# Patient Record
Sex: Male | Born: 1950 | Race: Black or African American | Hispanic: No | Marital: Married | Smoking: Former smoker
Health system: Southern US, Community
[De-identification: ages and names within clinical notes are randomized; demographics above are authoritative.]

## PROBLEM LIST (undated history)

## (undated) DIAGNOSIS — E119 Type 2 diabetes mellitus without complications: Secondary | ICD-10-CM

## (undated) DIAGNOSIS — I10 Essential (primary) hypertension: Secondary | ICD-10-CM

## (undated) DIAGNOSIS — E7800 Pure hypercholesterolemia, unspecified: Secondary | ICD-10-CM

## (undated) HISTORY — PX: OTHER SURGICAL HISTORY: SHX169

---

## 2000-02-09 ENCOUNTER — Emergency Department (HOSPITAL_COMMUNITY): Admission: EM | Admit: 2000-02-09 | Discharge: 2000-02-09 | Payer: Self-pay | Admitting: Emergency Medicine

## 2003-12-29 ENCOUNTER — Inpatient Hospital Stay (HOSPITAL_COMMUNITY): Admission: EM | Admit: 2003-12-29 | Discharge: 2004-01-01 | Payer: Self-pay | Admitting: Emergency Medicine

## 2004-01-22 ENCOUNTER — Encounter (HOSPITAL_COMMUNITY): Admission: RE | Admit: 2004-01-22 | Discharge: 2004-04-21 | Payer: Self-pay | Admitting: Cardiology

## 2004-06-05 ENCOUNTER — Emergency Department (HOSPITAL_COMMUNITY): Admission: EM | Admit: 2004-06-05 | Discharge: 2004-06-05 | Payer: Self-pay | Admitting: Emergency Medicine

## 2004-07-12 ENCOUNTER — Ambulatory Visit: Payer: Self-pay

## 2004-07-18 ENCOUNTER — Ambulatory Visit: Payer: Self-pay | Admitting: Cardiology

## 2004-09-30 ENCOUNTER — Ambulatory Visit: Payer: Self-pay | Admitting: *Deleted

## 2005-04-30 ENCOUNTER — Ambulatory Visit: Payer: Self-pay | Admitting: Cardiology

## 2006-12-03 ENCOUNTER — Ambulatory Visit: Payer: Self-pay | Admitting: Cardiology

## 2007-12-08 ENCOUNTER — Ambulatory Visit: Payer: Self-pay | Admitting: Cardiology

## 2007-12-08 LAB — CONVERTED CEMR LAB
Bilirubin, Direct: 0.1 mg/dL (ref 0.0–0.3)
GFR calc Af Amer: 89 mL/min
LDL Cholesterol: 82 mg/dL (ref 0–99)
Potassium: 3.9 meq/L (ref 3.5–5.1)
Sodium: 139 meq/L (ref 135–145)
Total Protein: 6.6 g/dL (ref 6.0–8.3)
Triglycerides: 78 mg/dL (ref 0–149)

## 2007-12-22 ENCOUNTER — Ambulatory Visit: Payer: Self-pay | Admitting: Cardiology

## 2009-03-10 DIAGNOSIS — E119 Type 2 diabetes mellitus without complications: Secondary | ICD-10-CM

## 2009-03-10 DIAGNOSIS — Z87891 Personal history of nicotine dependence: Secondary | ICD-10-CM

## 2009-03-10 DIAGNOSIS — I2510 Atherosclerotic heart disease of native coronary artery without angina pectoris: Secondary | ICD-10-CM | POA: Insufficient documentation

## 2009-03-10 DIAGNOSIS — E785 Hyperlipidemia, unspecified: Secondary | ICD-10-CM

## 2009-06-12 ENCOUNTER — Encounter (INDEPENDENT_AMBULATORY_CARE_PROVIDER_SITE_OTHER): Payer: Self-pay | Admitting: *Deleted

## 2009-07-25 ENCOUNTER — Ambulatory Visit: Payer: Self-pay | Admitting: Cardiology

## 2009-07-25 ENCOUNTER — Telehealth: Payer: Self-pay | Admitting: Cardiology

## 2009-07-25 DIAGNOSIS — R079 Chest pain, unspecified: Secondary | ICD-10-CM | POA: Insufficient documentation

## 2009-07-25 DIAGNOSIS — I10 Essential (primary) hypertension: Secondary | ICD-10-CM

## 2009-08-02 ENCOUNTER — Telehealth (INDEPENDENT_AMBULATORY_CARE_PROVIDER_SITE_OTHER): Payer: Self-pay | Admitting: *Deleted

## 2009-08-06 ENCOUNTER — Ambulatory Visit: Payer: Self-pay

## 2009-08-06 ENCOUNTER — Ambulatory Visit: Payer: Self-pay | Admitting: Cardiology

## 2009-08-06 ENCOUNTER — Encounter (HOSPITAL_COMMUNITY): Admission: RE | Admit: 2009-08-06 | Discharge: 2009-10-15 | Payer: Self-pay | Admitting: Cardiology

## 2009-08-06 ENCOUNTER — Encounter: Payer: Self-pay | Admitting: Cardiology

## 2010-12-24 NOTE — Assessment & Plan Note (Signed)
[facility]                            CARDIOLOGY OFFICE NOTE   ROGERIO, BOUTELLE                     MRN:          [id number]  DATE:12/22/2007                            DOB:          09-Aug-1951    PRIMARY CARE PHYSICIAN:  Dr. Dell Ponto of [facility] [place].   CLINICAL HISTORY:  Mr. Swaz[place]and is 60 years old and is an Engineering geologist  at [employer].  He returned for followup and management of his  coronary heart disease.  He had a diaphragmatic wall infarction in May  of 2005 treated with Cypher stent to the mid and distal right coronary  artery with residual 70% in the left anterior descending and 70% in the  marginal branch of the circumflex artery.  He has done quite well since  that time.  He has had no recent chest pain, shortness of breath, or  palpitations.   PAST MEDICAL HISTORY:  Significant for diabetes and hyperlipidemia.   CURRENT MEDICATIONS:  Plavix, insulin, Vytorin, enalapril, Toprol and  aspirin.   PHYSICAL EXAMINATION:  On examination, the blood pressure was 139/90 and  the pulse was 82 and regular.  There was no venous distension.  The carotid pulses were full without  bruits.  The chest was clear.  Cardiac rhythm was regular.  I could hear no murmurs or gallops.  The abdomen was soft with normal bowel sounds.  Peripheral pulses were full and there was no peripheral edema.   Recent laboratory studies showed an HDL of 29, an LDL of 82, and a total  cholesterol of 127.  His renal function and liver function tests were  normal.   IMPRESSION:  1. Coronary artery disease, status post diaphragmatic wall infarction      treated with non-overlapping Cypher stent to the mid and distal      right coronary in 2005 with residual disease as described above.  2. Good left ventricular function.  3. Insulin-dependent diabetes.  4. Hyperlipidemia.   RECOMMENDATIONS:  I talked with Mr. Swaz[place]and about  decreasing bad  carbohydrates in his diet and increasing his exercise to help with his  low HDL.  He asked about the safety of Viagra and I told him that was  safe, and I gave him a prescription for a short supply of this, and  asked  him to get a longer-term supply from his primary care physician if  needed.  Will plan to have followup in 1 year.     Bruce Elvera Lennox Juanda Chance, MD, [facility]  Electronically Signed    BRB/MedQ  DD: 12/22/2007  DT: 12/22/2007  Job #: [id number]

## 2010-12-27 NOTE — Discharge Summary (Signed)
NAME:  Jonathan Nixon, Jonathan Nixon                        ACCOUNT NO.:  [id number]   MEDICAL RECORD NO.:  [id number]                   PATIENT TYPE:  INP   LOCATION:  3734                                 FACILITY:  [facility]   PHYSICIAN:  Charlies Constable, M.D. [facility]              DATE OF BIRTH:  [birth year]   DATE OF ADMISSION:  12/29/2003  DATE OF DISCHARGE:                           DISCHARGE SUMMARY - REFERRING   DISCHARGE DIAGNOSES:  1. Acute inferior myocardial infarction, status post percutaneous coronary     intervention of the right coronary artery.  2. Ejection fraction 50%.  3. Diabetes mellitus, insulin-dependent.  4. Fever, resolved.  5. Hyperlipidemia, treated.  6. Residual coronary artery disease.  7. History of tobacco abuse.   HOSPITAL COURSE:  [name] is 60 year old male patient who developed chest  pain around 6 in the evening on Dec 29, 2003.  He thought he was  hypoglycemic at that time and treated himself for hypoglycemia.  He did feel  somewhat better, but felt that he needed to be checked and presented to the  emergency room.  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  The EKG  showed a 1- to -1.5 ST segment elevation in the inferior lateral leads.  He  was taken emergently to the cardiac catheterization lab, where he was found  to have a 95% stenosis of the right coronary artery followed by a 90%  lesion.  These were reduced to 0% lesions post procedure utilizing a CYPHER  stent.  The patient was __________  .  In addition, the patient was noted to  have a mid 70% LAD lesion as well as a 70% OM lesion and these were felt to  be needing medical therapy at this point.  __________  revealed akinesis of  the inferior wall with an EF of 50%.   The patient remained in the hospital until Jan 01, 2004.  He remained here  in stable condition, and once he was able to ambulate without difficulty, he  was  ready for discharge to home.   LABORATORY STUDIES:  During his hospital stay include blood cultures for a  fever that he ran during the hospital stay but this was felt to be secondary  to his myocardial infarction.  His blood cultures were negative.  Urinalysis  was negative.  Hemoglobin A1c was 14; however, he had just switched primary  care physicians and feels like this will be under better control, now that  his medication has been recently switched.  His maximum CK is 1226 with a MB  fraction of 77.7, maximum troponin was 8.52.   He has been on lipid-lowering agents in the past, and he will need to have a  redraw at his next appointment (or this will need to be setup).   He is discharged to home on the following  medications:  1. Lopressor 50 mg one half tablet t.i.d.  2. Aspirin 325 mg a day.  3. Zocor 40 mg q.h.s.  4. Plavix 75 mg a day.  5. He is to resume his home insulin dose.  6. Sublingual nitroglycerin p.r.n. chest pain.   No strenuous activity.  He is to progress activity as per cardiac  rehabilitation.  Remain on a low fat diet.  Clean cath site with soap and  water, no scrubbing.  Call for questions or concerns.  Followup with Dr.  Charlies Constable on January 30, 2004 at 9:30 a.m.      Guy Franco, P.A. [facility]                      Charlies Constable, M.D. [facility]    LB/MEDQ  D:  01/01/2004  T:  01/02/2004  Job:  [id number]   cc:   Kathrynn Speed, Dr.  Rondall Allegra, [place]

## 2010-12-27 NOTE — Cardiovascular Report (Signed)
NAME:  Jonathan Nixon, Travas E                        ACCOUNT NO.:  [id number]   MEDICAL RECORD NO.:  [id number]                   PATIENT TYPE:  INP   LOCATION:  2923                                 FACILITY:  [facility]   PHYSICIAN:  Charlies Constable, M.D. 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  This afternoon at 5:30 he developed the onset of weakness and  feeling clammy with mild chest tightness.  He was brought by his co-workers  over to the [facility] emergency room where he was thought to possibly be  hypoglycemic.  His sugar was normal and subsequently an EKG was obtained  which showed inferior and lateral ST elevation.  He was given aspirin,  Plavix and heparin and transported to the catheterization laboratory for  possible intervention.   PROCEDURE:  The procedure was performed via the right femoral artery using  an arterial sheath and 6 French preformed coronary catheters.  Omnipaque  contrast was used.  After completion of the diagnostic study, we made a  decision to proceed with intervention on the right coronary artery.  The  patient was given additional heparin __________ 200 seconds and was given  double bolus Integrilin infusion.   We used a 6 [place] guiding catheter with side holes and a PT2 light  support wire.  We crossed the lesion in the mid and distal right coronary  artery with the wire without too much difficulty.  We predilated with a 2.0  x 30 mm Maverick balloon, performing two inflations up to 8 atmospheres for  30 seconds.  We then deployed a 2.5 x 28 mm Cypher stent in the mid to  distal right coronary artery with one inflation of 11 atmospheres for 30  seconds and a second inflation of 12 atmospheres for 30 seconds with the  balloon inside the distal edge.  We then  post dilated with a 2.5 x 20 mm  Quantum Maverick, performing two inflations up to 16 atmospheres for 30  seconds.  We then deployed a second stent overlapping the first stent and  used a 2.5 x 23 mm Cypher.  We deployed this with one inflation of 12  atmospheres for 30 seconds.  We then performed four dilatations inside both  stents with the 2.5 x 20 mm Quantum Maverick balloon with inflations up to  16 atmospheres.  Repeat diagnostic study was then performed through a  guiding catheter.  The patient tolerated the procedure well and left the  laboratory in satisfactory condition.   RESULTS:  The LV pressure was 123/33.   CORONARY ARTERIES:  1. Left main coronary artery.  The left main coronary artery was free of     significant disease.  2. Left anterior  descending artery.  The left anterior descending artery     gave rise to three septal perforators and two diagonal branches.  The     study was irregular.  There was 70% segmental disease in the mid vessel.  3. Left circumflex.  Left circumflex artery gave rise to a ramus branch, an     atrial branch and a marginal branch and an AV branch.  The marginal     branch had 70-80% segmental stenosis.  4. Right coronary artery.  The right coronary artery was a small vessel.  It     gave rise to a large acute marginal branch and a small posterior     descending branch.  The vessel was diffusely diseased in its mid to     distal portion with 95% focal stenosis in the mid vessel and 90% focal     stenosis in the distal vessel.   LEFT VENTRICULOGRAM:  The left ventriculogram performed in the RAO  projection showed akinesis inferobasal segment.  The overall wall motion  looked good with an estimated ejection fraction of 50%.   Following stenting of the lesions in the mid and distal right coronary  artery with tandem overlying Cypher stents, the stenoses improved from 95  and 90% to 0%.  The flow was TIMI-3 before and after.   CONCLUSION:  1.  Acute diaphragmatic wall infarction with 95% mid and 90% distal stenosis     in the right coronary artery, 70-80% stenosis in the marginal branch of     the circumflex artery, 70% stenosis in the mid left anterior descending     artery and inferobasal wall hypokinesis with an estimated ejection     fraction of 50%.  2. Successful placement of tandem overlying Cypher stents in the mid and     distal right coronary artery with improvement in sentinel narrowing from     95 and 90% to 0%.   The patient had the onset of chest pain at 1730.  The patient arrived in the  emergency room at 1859.  The first ECG was performed at 2019.  The balloon  inflation was performed at 2221.  This gave a total balloon time of 3 hours  and 22 minutes and a reperfusion time of 4 hours and 51 minutes.   RECOMMENDATIONS:  1. Patient returned to the unit for further observation.  2. I will review the angiograms with my colleagues to decide if we should     consider intervention on the circumflex anterior descending artery.  Will     plan tight glucose control.                                               Charlies Constable, M.D. [facility]    BB/MEDQ  D:  12/29/2003  T:  12/31/2003  Job:  [id number]   cc:   Cardiopulmonary Lab

## 2010-12-31 NOTE — Assessment & Plan Note (Signed)
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 He had a diaphragmatic wall myocardial infarction  in May of 2005, treated with CYPHER stents in the mid and distal right  coronary artery. There was residual 70% narrowing in the LAD and 70%  percent narrowing in the marginal branch. He has done quite well since  that time. He says he has had no shortness of breath, chest pain or  palpitations. He is moderately active and is working [employer], but has not  been exercising regularly beyond this.   PAST MEDICAL HISTORY:  1. Diabetes.  2. Hyperlipidemia.   CURRENT MEDICATIONS:  1. Plavix.  2. Aspirin.  3. Insulin.  4. Enalapril.  5. Vytorin.  6. Lopressor.   PHYSICAL EXAMINATION:  The blood pressure is 141/89, pulse 97 and  regular.  There was no venous distention. The carotid pulses were full without  bruits.  CHEST: Was clear.  CARDIAC: Rhythm was regular. I could hear no murmurs or gallops.  ABDOMEN: Was soft with normal bowel sounds. There was no  hepatosplenomegaly.  The peripheral pulses were full and there was no peripheral edema.   An electrocardiogram was normal.   IMPRESSION:  1. Coronary artery disease, status post diaphragmatic wall myocardial      infarction treated with nine overlapping CYPHER stents in the mid      and distal right coronary artery in 2005, with residual disease as      described above.  2. Good left ventricular function.  3. Insulin dependent diabetes.  4. Hyperlipidemia.   RECOMMENDATIONS:  Jonathan Nixon appears to be doing well from the  standpoint of his heart. His blood pressure  is moderately elevated. Will  plan to obtain a fasting lipid, liver and CBC, chem-7 and hemoglobin A1c  and TSH. Will increase his enalapril from 5 to 10 mg a day and increase  his Lopressor from 50 mg two tablets in the morning and one tablet in  the afternoon to metoprolol XL 100 mg two tablets in the morning. I will  plan to see him back in followup in a year and will get back with him  about his lab tests after we have the results.     Bruce Elvera Lennox Juanda Chance, MD, [facility]  Electronically Signed    BRB/MedQ  DD: 12/03/2006  DT: 12/03/2006  Job #: [id number]

## 2014-07-15 ENCOUNTER — Other Ambulatory Visit: Payer: Self-pay | Admitting: Endocrinology

## 2015-01-11 ENCOUNTER — Other Ambulatory Visit: Payer: Self-pay | Admitting: Endocrinology

## 2015-03-15 ENCOUNTER — Encounter (HOSPITAL_BASED_OUTPATIENT_CLINIC_OR_DEPARTMENT_OTHER): Payer: Self-pay

## 2015-03-15 ENCOUNTER — Emergency Department (HOSPITAL_BASED_OUTPATIENT_CLINIC_OR_DEPARTMENT_OTHER)
Admission: EM | Admit: 2015-03-15 | Discharge: 2015-03-15 | Disposition: A | Discharge status: Other Acute Inpatient Hospital | Payer: TRICARE For Life (TFL) | Attending: Emergency Medicine | Admitting: Emergency Medicine

## 2015-03-15 ENCOUNTER — Emergency Department (HOSPITAL_BASED_OUTPATIENT_CLINIC_OR_DEPARTMENT_OTHER): Payer: TRICARE For Life (TFL)

## 2015-03-15 DIAGNOSIS — R0602 Shortness of breath: Secondary | ICD-10-CM | POA: Diagnosis not present

## 2015-03-15 DIAGNOSIS — E119 Type 2 diabetes mellitus without complications: Secondary | ICD-10-CM | POA: Diagnosis not present

## 2015-03-15 DIAGNOSIS — R112 Nausea with vomiting, unspecified: Secondary | ICD-10-CM | POA: Insufficient documentation

## 2015-03-15 DIAGNOSIS — Z7982 Long term (current) use of aspirin: Secondary | ICD-10-CM | POA: Diagnosis not present

## 2015-03-15 DIAGNOSIS — Z79899 Other long term (current) drug therapy: Secondary | ICD-10-CM | POA: Diagnosis not present

## 2015-03-15 DIAGNOSIS — Z9889 Other specified postprocedural states: Secondary | ICD-10-CM | POA: Diagnosis not present

## 2015-03-15 DIAGNOSIS — R05 Cough: Secondary | ICD-10-CM | POA: Insufficient documentation

## 2015-03-15 DIAGNOSIS — R079 Chest pain, unspecified: Secondary | ICD-10-CM | POA: Diagnosis present

## 2015-03-15 DIAGNOSIS — I10 Essential (primary) hypertension: Secondary | ICD-10-CM | POA: Diagnosis not present

## 2015-03-15 DIAGNOSIS — R0789 Other chest pain: Secondary | ICD-10-CM | POA: Insufficient documentation

## 2015-03-15 DIAGNOSIS — R000 Tachycardia, unspecified: Secondary | ICD-10-CM | POA: Diagnosis not present

## 2015-03-15 DIAGNOSIS — Z794 Long term (current) use of insulin: Secondary | ICD-10-CM | POA: Insufficient documentation

## 2015-03-15 DIAGNOSIS — Z880 Allergy status to penicillin: Secondary | ICD-10-CM | POA: Diagnosis not present

## 2015-03-15 DIAGNOSIS — E780 Pure hypercholesterolemia: Secondary | ICD-10-CM | POA: Insufficient documentation

## 2015-03-15 HISTORY — DX: Type 2 diabetes mellitus without complications: E11.9

## 2015-03-15 HISTORY — DX: Essential (primary) hypertension: I10

## 2015-03-15 HISTORY — DX: Pure hypercholesterolemia, unspecified: E78.00

## 2015-03-15 LAB — CBC WITH DIFFERENTIAL/PLATELET
BASOS ABS: 0 10*3/uL (ref 0.0–0.1)
BASOS PCT: 0 % (ref 0–1)
EOS PCT: 0 % (ref 0–5)
Eosinophils Absolute: 0 10*3/uL (ref 0.0–0.7)
HEMATOCRIT: 40.6 % (ref 39.0–52.0)
Hemoglobin: 13.8 g/dL (ref 13.0–17.0)
LYMPHS ABS: 1.5 10*3/uL (ref 0.7–4.0)
LYMPHS PCT: 23 % (ref 12–46)
MCH: 29.2 pg (ref 26.0–34.0)
MCHC: 34 g/dL (ref 30.0–36.0)
MCV: 85.8 fL (ref 78.0–100.0)
MONOS PCT: 3 % (ref 3–12)
Monocytes Absolute: 0.2 10*3/uL (ref 0.1–1.0)
Neutro Abs: 5 10*3/uL (ref 1.7–7.7)
Neutrophils Relative %: 74 % (ref 43–77)
Platelets: 303 10*3/uL (ref 150–400)
RBC: 4.73 MIL/uL (ref 4.22–5.81)
RDW: 12.9 % (ref 11.5–15.5)
WBC: 6.8 10*3/uL (ref 4.0–10.5)

## 2015-03-15 LAB — URINALYSIS, ROUTINE W REFLEX MICROSCOPIC
Bilirubin Urine: NEGATIVE
Ketones, ur: 15 mg/dL — AB
Leukocytes, UA: NEGATIVE
NITRITE: NEGATIVE
PH: 8 (ref 5.0–8.0)
PROTEIN: 30 mg/dL — AB
Specific Gravity, Urine: 1.039 — ABNORMAL HIGH (ref 1.005–1.030)
Urobilinogen, UA: 0.2 mg/dL (ref 0.0–1.0)

## 2015-03-15 LAB — URINE MICROSCOPIC-ADD ON

## 2015-03-15 LAB — COMPREHENSIVE METABOLIC PANEL
ALBUMIN: 4.2 g/dL (ref 3.5–5.0)
ALK PHOS: 123 U/L (ref 38–126)
ALT: 34 U/L (ref 17–63)
AST: 45 U/L — ABNORMAL HIGH (ref 15–41)
Anion gap: 11 (ref 5–15)
BILIRUBIN TOTAL: 0.6 mg/dL (ref 0.3–1.2)
BUN: 22 mg/dL — AB (ref 6–20)
CALCIUM: 9.4 mg/dL (ref 8.9–10.3)
CHLORIDE: 101 mmol/L (ref 101–111)
CO2: 25 mmol/L (ref 22–32)
Creatinine, Ser: 1.28 mg/dL — ABNORMAL HIGH (ref 0.61–1.24)
GFR calc non Af Amer: 58 mL/min — ABNORMAL LOW (ref >60)
Glucose, Bld: 234 mg/dL — ABNORMAL HIGH (ref 65–99)
POTASSIUM: 4.3 mmol/L (ref 3.5–5.1)
SODIUM: 137 mmol/L (ref 135–145)
TOTAL PROTEIN: 8.3 g/dL — AB (ref 6.5–8.1)

## 2015-03-15 LAB — CBG MONITORING, ED: Glucose-Capillary: 234 mg/dL — ABNORMAL HIGH (ref 65–99)

## 2015-03-15 LAB — TROPONIN I: TROPONIN I: 0.15 ng/mL — AB (ref <0.031)

## 2015-03-15 LAB — LIPASE, BLOOD: Lipase: 25 U/L (ref 22–51)

## 2015-03-15 MED ORDER — ACETAMINOPHEN 325 MG PO TABS
650.0000 mg | ORAL_TABLET | Freq: Once | ORAL | Status: AC
  Administered 2015-03-15: 650 mg via ORAL

## 2015-03-15 MED ORDER — IOHEXOL 350 MG/ML SOLN
100.0000 mL | Freq: Once | INTRAVENOUS | Status: AC | PRN
  Administered 2015-03-15: 100 mL via INTRAVENOUS

## 2015-03-15 MED ORDER — SODIUM CHLORIDE 0.9 % IV BOLUS (SEPSIS)
1000.0000 mL | Freq: Once | INTRAVENOUS | Status: AC
  Administered 2015-03-15: 1000 mL via INTRAVENOUS

## 2015-03-15 MED ORDER — PROMETHAZINE HCL 25 MG/ML IJ SOLN
25.0000 mg | Freq: Once | INTRAMUSCULAR | Status: AC
  Administered 2015-03-15: 25 mg via INTRAVENOUS
  Filled 2015-03-15: qty 1

## 2015-03-15 MED ORDER — ACETAMINOPHEN 325 MG PO TABS
ORAL_TABLET | ORAL | Status: AC
  Filled 2015-03-15: qty 2

## 2015-03-15 MED ORDER — NITROGLYCERIN 2 % TD OINT
1.0000 [in_us] | TOPICAL_OINTMENT | Freq: Four times a day (QID) | TRANSDERMAL | Status: DC
  Administered 2015-03-15: 1 [in_us] via TOPICAL
  Filled 2015-03-15: qty 1

## 2015-03-15 MED ORDER — ONDANSETRON HCL 4 MG/2ML IJ SOLN
4.0000 mg | Freq: Once | INTRAMUSCULAR | Status: AC
  Administered 2015-03-15: 4 mg via INTRAVENOUS
  Filled 2015-03-15: qty 2

## 2015-03-15 NOTE — ED Notes (Signed)
Patient transported to CT 

## 2015-03-15 NOTE — ED Notes (Signed)
Report called to Judeth Cornfield, Charity fundraiser at [facility], room [phone]

## 2015-03-15 NOTE — ED Notes (Signed)
Blood sugar checked. Same was 234.

## 2015-03-15 NOTE — ED Provider Notes (Signed)
CSN: [id number]     Arrival date & time 03/15/15  1357 History   First MD Initiated Contact with Patient 03/15/15 1401     Chief Complaint  Patient presents with  . Chest Pain     (Consider location/radiation/quality/duration/timing/severity/associated sxs/prior Treatment) HPI Comments: 64 year old male presenting with gradual onset left-sided chest discomfort with associated nausea and vomiting beginning yesterday. States he was cleaning his floors yesterday, started to feel bad, went downstairs to get something to eat when he started to feel very nauseous and vomit. He's had multiple episodes of nonbloody emesis since yesterday, continuing into today. The discomfort in his chest is on the left lower aspect and left upper quadrant of abdomen. States the pain is not reproducible. Denies pain, just reports the discomfort. Admits to associated productive cough and shortness of breath. He went to [facility] today, given Zofran and was advised to go to the emergency department. Denies fevers or back pain. History of cardiac cath with 2 stents placed in 2005 by Dr. Corinda Gubler who is now retired. Currently seen by [facility].  Patient is a 64 y.o. male presenting with chest pain. The history is provided by the patient.  Chest Pain Associated symptoms: cough, nausea, shortness of breath and vomiting     Past Medical History  Diagnosis Date  . Hypertension   . Diabetes mellitus without complication   . High cholesterol    Past Surgical History  Procedure Laterality Date  . Cardiac cath     No family history on file. History  Substance Use Topics  . Smoking status: Never Smoker   . Smokeless tobacco: Not on file  . Alcohol Use: No    Review of Systems  Respiratory: Positive for cough and shortness of breath.   Cardiovascular: Positive for chest pain.  Gastrointestinal: Positive for nausea and vomiting.  All other systems reviewed and are negative.     Allergies   Penicillins  Home Medications   Prior to Admission medications   Medication Sig Start Date End Date Taking? Authorizing Provider  amLODipine-benazepril (LOTREL) 10-20 MG per capsule Take 1 capsule by mouth daily.   Yes Historical Provider, MD  aspirin 81 MG tablet Take 81 mg by mouth daily.   Yes Historical Provider, MD  carvedilol (COREG CR) 20 MG 24 hr capsule Take 20 mg by mouth daily.   Yes Historical Provider, MD  clopidogrel (PLAVIX) 75 MG tablet Take 75 mg by mouth daily.   Yes Historical Provider, MD  ezetimibe-simvastatin (VYTORIN) 10-10 MG per tablet Take 1 tablet by mouth at bedtime.   Yes Historical Provider, MD  insulin glargine (LANTUS) 100 UNIT/ML injection Inject into the skin at bedtime.   Yes Historical Provider, MD  insulin regular (NOVOLIN R,HUMULIN R) 100 units/mL injection Inject into the skin 3 (three) times daily before meals.   Yes Historical Provider, MD   BP 142/115 mmHg  Pulse 108  Temp(Src) 98.4 F (36.9 C) (Oral)  Resp 29  Ht 5\' 10"  (1.778 m)  Wt 207 lb (93.895 kg)  BMI 29.70 kg/m2  SpO2 93% Physical Exam  Constitutional: He is oriented to person, place, and time. He appears well-developed and well-nourished. He appears ill. No distress.  HENT:  Head: Normocephalic and atraumatic.  Mouth/Throat: Oropharynx is clear and moist.  Eyes: Conjunctivae and EOM are normal. Pupils are equal, round, and reactive to light.  Neck: Normal range of motion. Neck supple. No JVD present.  Cardiovascular: Regular rhythm, normal heart sounds and intact  distal pulses.   Tachy. No extremity edema.  Pulmonary/Chest: Effort normal and breath sounds normal. No respiratory distress. He exhibits no tenderness.  Abdominal: Soft. Bowel sounds are normal. He exhibits no distension and no mass. There is no tenderness. There is no rebound and no guarding.  Musculoskeletal: Normal range of motion. He exhibits no edema.  Neurological: He is alert and oriented to person, place, and  time. He has normal strength. No sensory deficit.  Speech fluent, goal oriented. Moves extremities without ataxia. Equal grip strength bilateral.  Skin: Skin is warm and dry. He is not diaphoretic.  Psychiatric: He has a normal mood and affect. His behavior is normal.  Nursing note and vitals reviewed.   ED Course  Procedures (including critical care time) Labs Review Labs Reviewed  COMPREHENSIVE METABOLIC PANEL - Abnormal; Notable for the following:    Glucose, Bld 234 (*)    BUN 22 (*)    Creatinine, Ser 1.28 (*)    Total Protein 8.3 (*)    AST 45 (*)    GFR calc non Af Amer 58 (*)    All other components within normal limits  TROPONIN I - Abnormal; Notable for the following:    Troponin I 0.15 (*)    All other components within normal limits  CBG MONITORING, ED - Abnormal; Notable for the following:    Glucose-Capillary 234 (*)    All other components within normal limits  CBC WITH DIFFERENTIAL/PLATELET  LIPASE, BLOOD    Imaging Review Dg Chest 2 View  03/15/2015   CLINICAL DATA:  Chest pain with nausea and vomiting beginning the night of 03/14/2015.  EXAM: CHEST  2 VIEW  COMPARISON:  Single view of the chest 12/31/2003.  FINDINGS: Heart size and mediastinal contours are within normal limits. Both lungs are clear. Visualized skeletal structures are unremarkable.  IMPRESSION: Negative exam.   Electronically Signed   By: Drusilla Kanner M.D.   On: 03/15/2015 14:41   Ct Angio Chest Aorta W/cm &/or Wo/cm  03/15/2015   CLINICAL DATA:  Left-sided chest pain and discomfort. Nausea and vomiting. Symptoms began 03/14/2015.  EXAM: CT ANGIOGRAPHY CHEST, ABDOMEN AND PELVIS  TECHNIQUE: Multidetector CT imaging through the chest, abdomen and pelvis was performed using the standard protocol during bolus administration of intravenous contrast. Multiplanar reconstructed images and MIPs were obtained and reviewed to evaluate the vascular anatomy.  CONTRAST:  100 mL OMNIPAQUE IOHEXOL 350 MG/ML  SOLN  COMPARISON:  Plain film of the chest earlier today and 12/31/2003.  FINDINGS: CTA CHEST FINDINGS  There is no aortic dissection or aneurysm. Calcific coronary atherosclerosis is identified. Heart size is normal. No pleural or pericardial effusion. No pulmonary embolus is identified. The lungs are clear.  Review of the MIP images confirms the above findings.  CTA ABDOMEN AND PELVIS FINDINGS  No aortic dissection or aneurysm is identified. Scattered calcific atherosclerotic vascular disease is seen. Branch vessels of the aorta are widely patent.  The liver is somewhat low attenuating compatible with fatty infiltration. The gallbladder, adrenal glands, spleen, kidneys and pancreas are all unremarkable. No lymphadenopathy or fluid is seen. Urinary bladder, seminal vesicles and prostate gland appear normal. A few diverticula are seen about the colon but there is no evidence of diverticulitis. The stomach, small bowel and appendix appear normal. No lymphadenopathy or fluid.  No bony abnormality is identified.  Review of the MIP images confirms the above findings.  IMPRESSION: Negative for aortic dissection. No acute finding chest, abdomen or pelvis.  Calcific coronary and aortic atherosclerosis.  Fatty infiltration of the liver.   Electronically Signed   By: Drusilla Kanner M.D.   On: 03/15/2015 16:33   Ct Cta Abd/pel W/cm &/or W/o Cm  03/15/2015   CLINICAL DATA:  Left-sided chest pain and discomfort. Nausea and vomiting. Symptoms began 03/14/2015.  EXAM: CT ANGIOGRAPHY CHEST, ABDOMEN AND PELVIS  TECHNIQUE: Multidetector CT imaging through the chest, abdomen and pelvis was performed using the standard protocol during bolus administration of intravenous contrast. Multiplanar reconstructed images and MIPs were obtained and reviewed to evaluate the vascular anatomy.  CONTRAST:  100 mL OMNIPAQUE IOHEXOL 350 MG/ML SOLN  COMPARISON:  Plain film of the chest earlier today and 12/31/2003.  FINDINGS: CTA CHEST FINDINGS   There is no aortic dissection or aneurysm. Calcific coronary atherosclerosis is identified. Heart size is normal. No pleural or pericardial effusion. No pulmonary embolus is identified. The lungs are clear.  Review of the MIP images confirms the above findings.  CTA ABDOMEN AND PELVIS FINDINGS  No aortic dissection or aneurysm is identified. Scattered calcific atherosclerotic vascular disease is seen. Branch vessels of the aorta are widely patent.  The liver is somewhat low attenuating compatible with fatty infiltration. The gallbladder, adrenal glands, spleen, kidneys and pancreas are all unremarkable. No lymphadenopathy or fluid is seen. Urinary bladder, seminal vesicles and prostate gland appear normal. A few diverticula are seen about the colon but there is no evidence of diverticulitis. The stomach, small bowel and appendix appear normal. No lymphadenopathy or fluid.  No bony abnormality is identified.  Review of the MIP images confirms the above findings.  IMPRESSION: Negative for aortic dissection. No acute finding chest, abdomen or pelvis.  Calcific coronary and aortic atherosclerosis.  Fatty infiltration of the liver.   Electronically Signed   By: Drusilla Kanner M.D.   On: 03/15/2015 16:33     EKG Interpretation None      MDM   Final diagnoses:  Atypical chest pain  Non-intractable vomiting with nausea, vomiting of unspecified type   Ill appearance but in no apparent distress. Afebrile. Tachycardic, vitals otherwise stable on arrival. Significant cardiac history. Atypical chest discomfort with associated nausea, vomiting, cough and shortness of breath. EKG showing sinus tachycardia no other acute findings. Given the vague chest discomfort and abdominal pain, initial concern for possible dissection with the tachycardia and appearance of patient. After evaluation by Dr. Fredderick Phenix, suggesting CT angiogram chest, abdomen and pelvis. CT obtained without acute finding. Patient given nitroglycerin  paste with some relief of the discomfort. Still very nauseated. Will add on Phenergan in addition to Zofran. Troponin 0.15. Patient preferring to be admitted to [facility], I spoke with cardiology on call Dr. Sampson Goon who states he will evaluate patient with admission to the hospitalist. I spoke with Dr. Jacelyn Pi, hospitalist at [facility] who accepts the patient for transfer. Patient stable for transfer.  Discussed with attending Dr. Fredderick Phenix who also evaluated patient and agrees with plan of care.  Kathrynn Speed, PA-C 03/15/15 1743  Rolan Bucco, MD 03/16/15 [id number]

## 2015-03-15 NOTE — ED Notes (Signed)
Pt had 1 episode of emesis. 

## 2015-03-15 NOTE — ED Provider Notes (Signed)
ED ECG REPORT   Date: 03/15/2015  Rate: 102  Rhythm: sinus tachycardia  QRS Axis: normal  Intervals: normal  ST/T Wave abnormalities: nonspecific ST/T changes  Conduction Disutrbances:none  Narrative Interpretation:   Old EKG Reviewed: none available  I have personally reviewed the EKG tracing and agree with the computerized printout as noted.   Rolan Bucco, MD 03/15/15 [id number]

## 2015-03-15 NOTE — ED Notes (Signed)
PA Student at bedside

## 2015-03-15 NOTE — ED Notes (Signed)
Pt reports chest pain with nausea and vomiting since yesterday. Seen at [facility], told to come to ED. Zofran given at [facility].

## 2015-03-15 NOTE — ED Notes (Signed)
PA at bedside.

## 2015-03-15 NOTE — ED Notes (Signed)
Pt. returned from XR. 

## 2016-02-22 IMAGING — CT CT CTA ABD/PEL W/CM AND/OR W/O CM
2 of 7 series · 15 of 46 positions shown, 17 images · IV contrast (APPLIED)
Comparison: Plain film of the chest earlier today and 12/31/2003.

CLINICAL DATA: Left-sided chest pain and discomfort. Nausea and
vomiting. Symptoms began 03/14/2015.

EXAM:
CT ANGIOGRAPHY CHEST, ABDOMEN AND PELVIS
TECHNIQUE: Multidetector CT imaging through the chest, abdomen and pelvis was
performed using the standard protocol during bolus administration of
intravenous contrast. Multiplanar reconstructed images and MIPs were
obtained and reviewed to evaluate the vascular anatomy.
CONTRAST:  100 mL OMNIPAQUE IOHEXOL 350 MG/ML SOLN

[Series 6: dissection 2.0 b26f · axial · 0.78mm/px · z∈[-514,+86]mm · 12 of 336 slices shown, 14 images]
[im 18/336  soft-tissue]
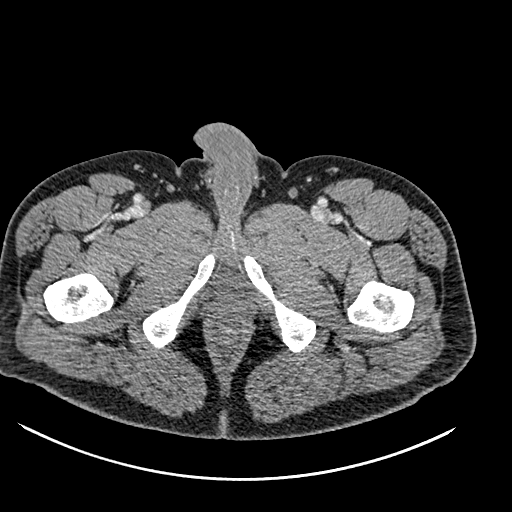
[im 18/336  bone]
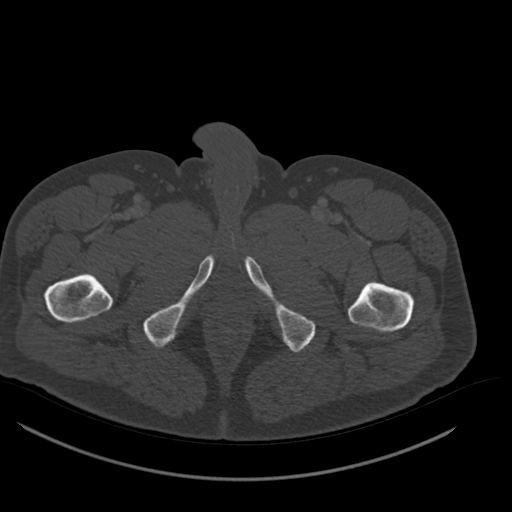
[im 53/336  soft-tissue]
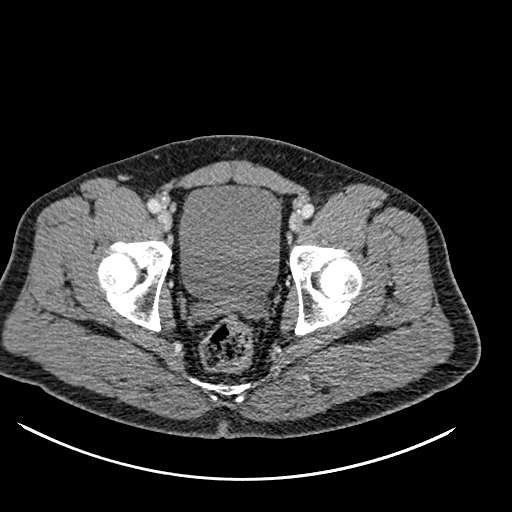
[im 71/336  soft-tissue]
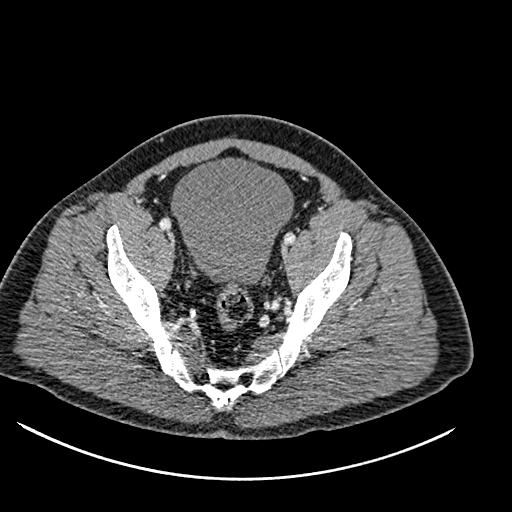
[im 106/336  soft-tissue]
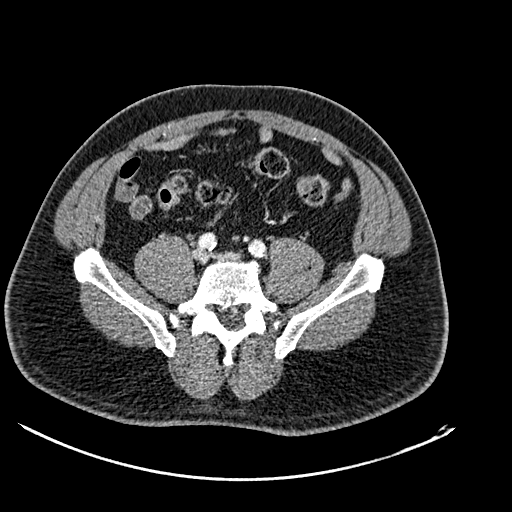
[im 124/336  soft-tissue]
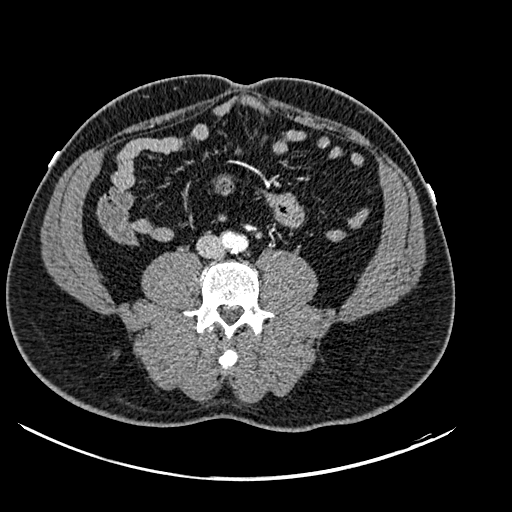
[im 159/336  soft-tissue]
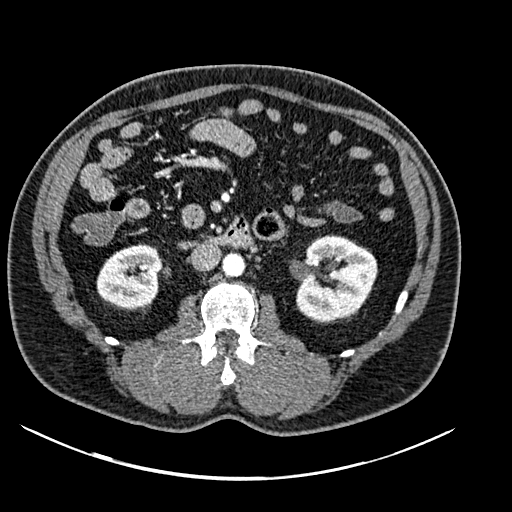
[im 177/336  soft-tissue]
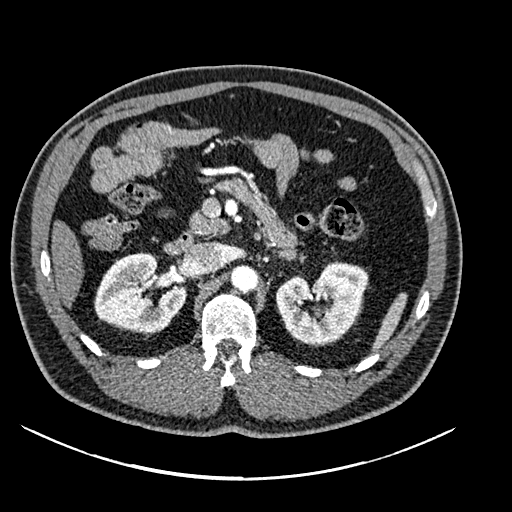
[im 212/336  soft-tissue]
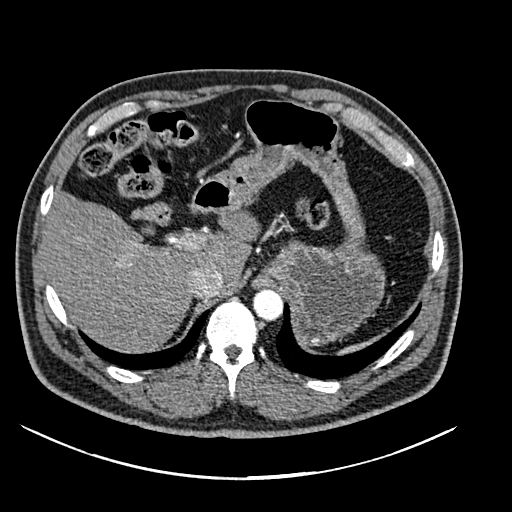
[im 230/336  soft-tissue]
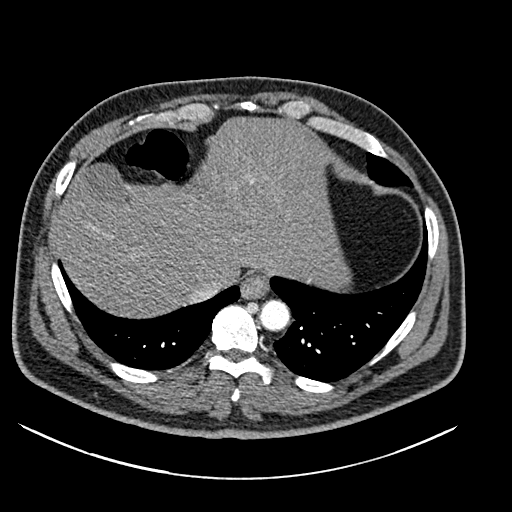
[im 230/336  bone]
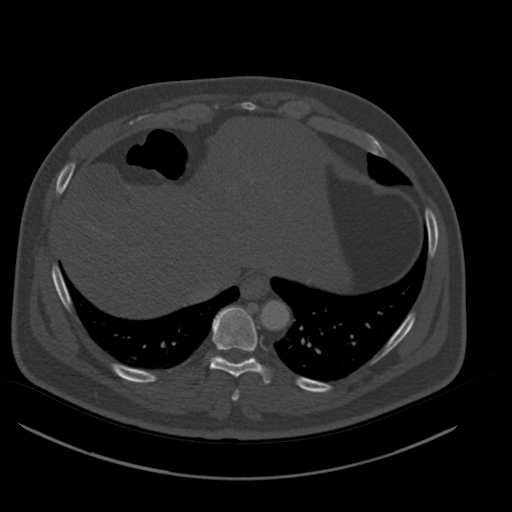
[im 265/336  soft-tissue]
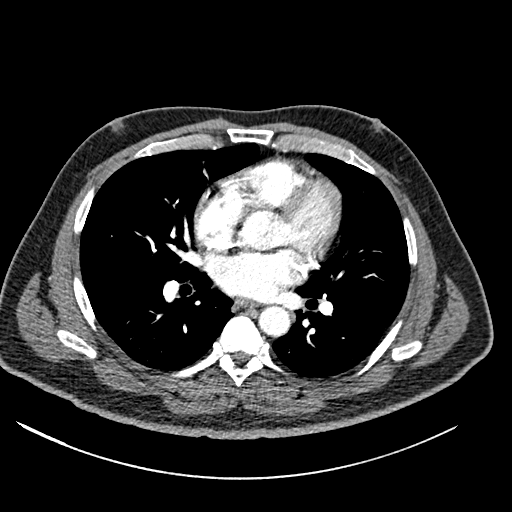
[im 283/336  soft-tissue]
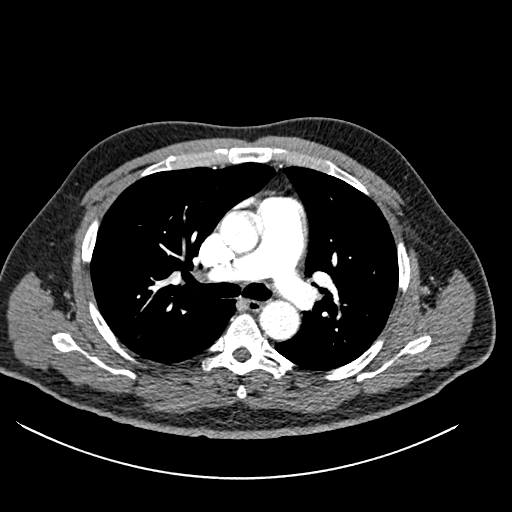
[im 318/336  soft-tissue]
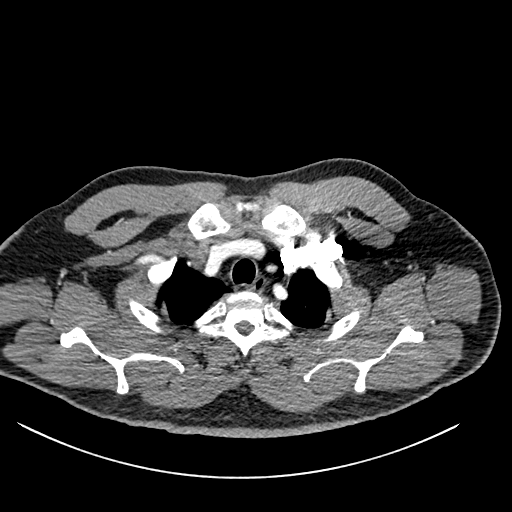

[Series 9: dissection 2.0 coronal · coronal · 0.78mm/px · 3 of 136 slices shown]
[im 34/136  soft-tissue]
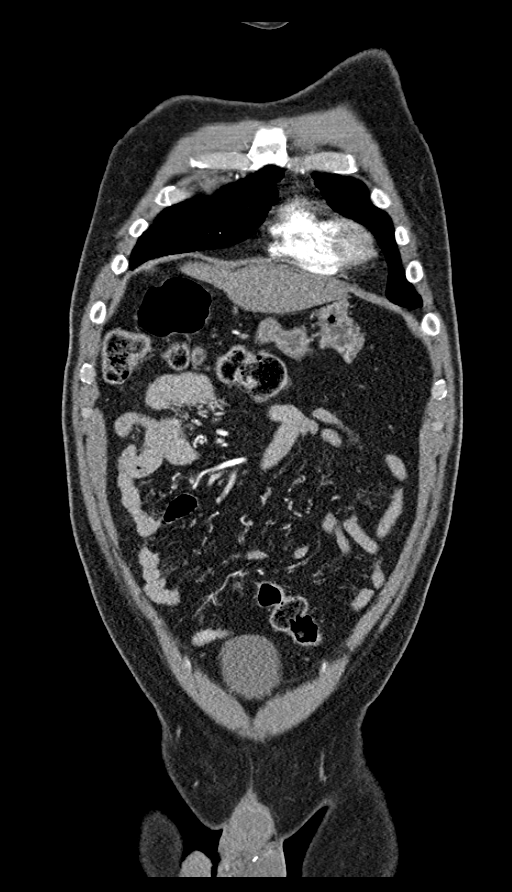
[im 68/136  soft-tissue]
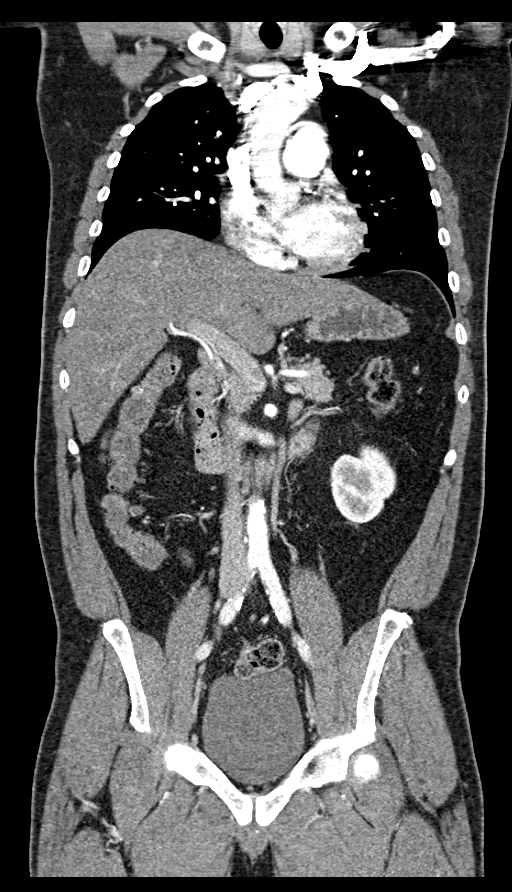
[im 102/136  soft-tissue]
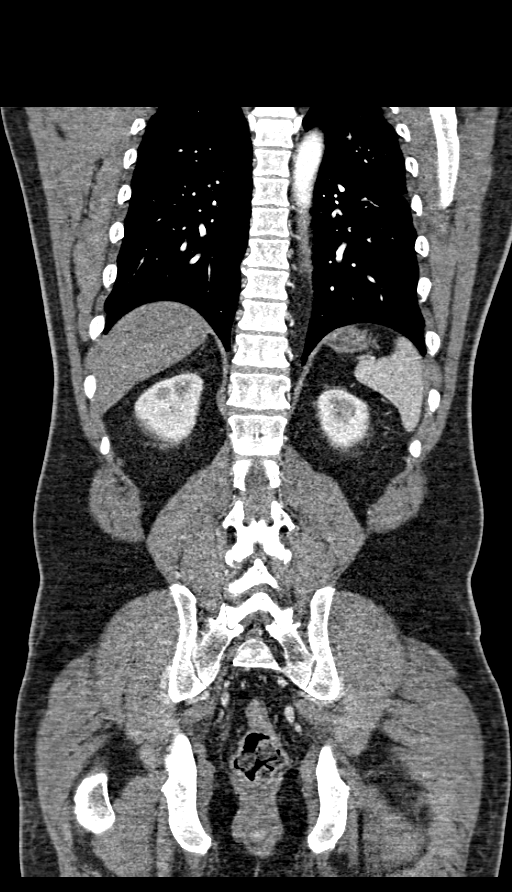

[15 of 46 positions shown; findings below may reference images not displayed]

FINDINGS: CTA CHEST FINDINGS

There is no aortic dissection or aneurysm. Calcific coronary
atherosclerosis is identified. Heart size is normal. No pleural or
pericardial effusion. No pulmonary embolus is identified. The lungs
are clear.

Review of the MIP images confirms the above findings.

CTA ABDOMEN AND PELVIS FINDINGS

No aortic dissection or aneurysm is identified. Scattered calcific
atherosclerotic vascular disease is seen. Branch vessels of the
aorta are widely patent.

The liver is somewhat low attenuating compatible with fatty
infiltration. The gallbladder, adrenal glands, spleen, kidneys and
pancreas are all unremarkable. No lymphadenopathy or fluid is seen.
Urinary bladder, seminal vesicles and prostate gland appear normal.
A few diverticula are seen about the colon but there is no evidence
of diverticulitis. The stomach, small bowel and appendix appear
normal. No lymphadenopathy or fluid.

No bony abnormality is identified.

Review of the MIP images confirms the above findings.
IMPRESSION: Negative for aortic dissection. No acute finding chest, abdomen or
pelvis.

Calcific coronary and aortic atherosclerosis.

Fatty infiltration of the liver.

## 2018-02-09 ENCOUNTER — Other Ambulatory Visit: Payer: Self-pay

## 2018-02-09 ENCOUNTER — Emergency Department (HOSPITAL_BASED_OUTPATIENT_CLINIC_OR_DEPARTMENT_OTHER)
Admission: EM | Admit: 2018-02-09 | Discharge: 2018-02-09 | Disposition: A | Discharge status: Home / Self Care | Payer: Medicare Other | Attending: Emergency Medicine | Admitting: Emergency Medicine

## 2018-02-09 ENCOUNTER — Emergency Department (HOSPITAL_BASED_OUTPATIENT_CLINIC_OR_DEPARTMENT_OTHER): Payer: Medicare Other

## 2018-02-09 ENCOUNTER — Encounter (HOSPITAL_BASED_OUTPATIENT_CLINIC_OR_DEPARTMENT_OTHER): Payer: Self-pay

## 2018-02-09 DIAGNOSIS — Z87891 Personal history of nicotine dependence: Secondary | ICD-10-CM | POA: Insufficient documentation

## 2018-02-09 DIAGNOSIS — Z7902 Long term (current) use of antithrombotics/antiplatelets: Secondary | ICD-10-CM | POA: Diagnosis not present

## 2018-02-09 DIAGNOSIS — R2242 Localized swelling, mass and lump, left lower limb: Secondary | ICD-10-CM | POA: Diagnosis present

## 2018-02-09 DIAGNOSIS — Z7982 Long term (current) use of aspirin: Secondary | ICD-10-CM | POA: Insufficient documentation

## 2018-02-09 DIAGNOSIS — I10 Essential (primary) hypertension: Secondary | ICD-10-CM | POA: Insufficient documentation

## 2018-02-09 DIAGNOSIS — M7989 Other specified soft tissue disorders: Secondary | ICD-10-CM | POA: Insufficient documentation

## 2018-02-09 DIAGNOSIS — I2510 Atherosclerotic heart disease of native coronary artery without angina pectoris: Secondary | ICD-10-CM | POA: Diagnosis not present

## 2018-02-09 DIAGNOSIS — Z79899 Other long term (current) drug therapy: Secondary | ICD-10-CM | POA: Diagnosis not present

## 2018-02-09 DIAGNOSIS — Z794 Long term (current) use of insulin: Secondary | ICD-10-CM | POA: Insufficient documentation

## 2018-02-09 DIAGNOSIS — E119 Type 2 diabetes mellitus without complications: Secondary | ICD-10-CM | POA: Insufficient documentation

## 2018-02-09 NOTE — ED Triage Notes (Signed)
Pt states he was dx with infection to left LE in Feb-completed abx-area worse x 3 week-NAD-steady gait

## 2018-02-09 NOTE — ED Provider Notes (Addendum)
[facility] EMERGENCY DEPARTMENT Provider Note  CSN: [id number] Arrival date & time: 02/09/18  1624    History   Chief Complaint Chief Complaint  Patient presents with  . Leg Problem    HPI Jonathan Nixon is a 68 y.o. male with a medical history of Type 2 DM, peripheral neuropathy HTN, HLD and CAD who presented to the ED for left leg swelling x5 months. Patient reports being treated for cellulitis with Clindamycin x10 days in 09/2017. He states that he has not noticed any change in his leg after completing the antibiotics. Denies leg pain, redness, warmth or tenderness. He states that it is just swollen. He has no problems walking. He endorses that a scab recently fell off and has had some mild drainage from site, but did not even notice it was there until recently. Denies chest pain, SOB, cough, palpitations, arthralgias, rash or skin lesion, and fever.   Past Medical History:  Diagnosis Date  . Diabetes mellitus without complication (HCC)   . High cholesterol   . Hypertension     Patient Active Problem List   Diagnosis Date Noted  . HYPERTENSION, BENIGN 07/25/2009  . CHEST PAIN UNSPECIFIED 07/25/2009  . DIABETES MELLITUS, TYPE II 03/10/2009  . HYPERLIPIDEMIA 03/10/2009  . CAD 03/10/2009  . TOBACCO ABUSE, HX OF 03/10/2009    Past Surgical History:  Procedure Laterality Date  . cardiac cath          Home Medications    Prior to Admission medications   Medication Sig Start Date End Date Taking? Authorizing Provider  amLODipine-benazepril (LOTREL) 10-20 MG per capsule Take 1 capsule by mouth daily.   Yes [provider]  aspirin 81 MG tablet Take 81 mg by mouth daily.   Yes [provider]  atorvastatin (LIPITOR) 40 MG tablet Take 40 mg by mouth daily.   Yes [provider]  benazepril (LOTENSIN) 20 MG tablet Take 20 mg by mouth daily.   Yes [provider]  carvedilol (COREG CR) 20 MG 24 hr capsule Take 20 mg by mouth  daily.   Yes [provider]  insulin regular (NOVOLIN R,HUMULIN R) 100 units/mL injection Inject into the skin 3 (three) times daily before meals.   Yes [provider]  metFORMIN (GLUCOPHAGE) 500 MG tablet Take by mouth 2 (two) times daily with a meal.   Yes [provider]  clopidogrel (PLAVIX) 75 MG tablet Take 75 mg by mouth daily.    [provider]  ezetimibe-simvastatin (VYTORIN) 10-10 MG per tablet Take 1 tablet by mouth at bedtime.    [provider]  insulin glargine (LANTUS) 100 UNIT/ML injection Inject into the skin at bedtime.    [provider]    Family History No family history on file.  Social History Social History   Tobacco Use  . Smoking status: Former Games developer  . Smokeless tobacco: Never Used  Substance Use Topics  . Alcohol use: Yes    Comment: occ  . Drug use: Never     Allergies   Penicillins   Review of Systems Review of Systems  Constitutional: Negative for activity change, appetite change, chills and fever.  Respiratory: Negative for cough, chest tightness and shortness of breath.   Cardiovascular: Positive for leg swelling. Negative for chest pain and palpitations.  Endocrine: Negative.   Genitourinary: Negative.   Musculoskeletal: Negative for arthralgias, gait problem, joint swelling and myalgias.  Skin: Positive for color change and wound.  Allergic/Immunologic: Negative.  Neurological: Positive for numbness. Negative for dizziness, weakness, light-headedness and headaches.  Hematological: Negative.      Physical Exam Updated Vital Signs BP (!) 145/87 (BP Location: Right Arm)   Pulse 86   Temp 98.5 F (36.9 C) (Oral)   Resp 18   Ht 5\' 11"  (1.803 m)   Wt 97.5 kg (215 lb)   SpO2 97%   BMI 29.99 kg/m   Physical Exam  Constitutional: He appears well-developed and well-nourished. No distress.  Patient sitting upright comfortably on bed.  Eyes: Pupils are equal, round, and  reactive to light. Conjunctivae and EOM are normal.  Neck: Normal range of motion. Neck supple.  Cardiovascular: Normal rate, regular rhythm, normal heart sounds and intact distal pulses.  Pulses:      Dorsalis pedis pulses are 2+ on the right side, and 2+ on the left side.       Posterior tibial pulses are 2+ on the right side, and 2+ on the left side.  Pulmonary/Chest: Effort normal and breath sounds normal.  Musculoskeletal:       Right knee: Normal.       Left knee: Normal.       Right ankle: Normal.       Left ankle: Normal.       Right lower leg: Normal.       Left lower leg: He exhibits swelling. He exhibits no tenderness, no bony tenderness and no deformity.       Right foot: Normal.       Left foot: Normal.  Neurological: He has normal strength. A sensory deficit is present. He exhibits normal muscle tone.  Reflex Scores:      Patellar reflexes are 1+ on the right side and 1+ on the left side.      Achilles reflexes are 1+ on the right side and 1+ on the left side. Decreased DTRs in lower extremities bilaterally. Decreased sensation in feet bilaterally.  Skin: Skin is warm. Capillary refill takes less than 2 seconds. No rash noted. No erythema.  Left lower leg has dark pigmentation when compared to right. No erythema, tenderness or warmth around open wound that is ~ 2cm round and superficial. No drainage or bleeding from wound. 1+ pitting edema in left leg up to mid-shin.  Nursing note and vitals reviewed.      ED Treatments / Results  Labs (all labs ordered are listed, but only abnormal results are displayed) Labs Reviewed - No data to display  EKG None  Radiology US Venous Img Lower Unilateral Left  Result Date: 02/09/2018 CLINICAL DATA:  LEFT lower extremity swelling for 5-6 months, no improvement with antibiotics, question deep venous thrombosis; history hypertension, diabetes mellitus EXAM: LEFT LOWER EXTREMITY VENOUS DOPPLER ULTRASOUND TECHNIQUE: Gray-scale  sonography with graded compression, as well as color Doppler and duplex ultrasound were performed to evaluate the lower extremity deep venous systems from the level of the common femoral vein and including the common femoral, femoral, profunda femoral, popliteal and calf veins including the posterior tibial, peroneal and gastrocnemius veins when visible. The superficial great saphenous vein was also interrogated. Spectral Doppler was utilized to evaluate flow at rest and with distal augmentation maneuvers in the common femoral, femoral and popliteal veins. COMPARISON:  None FINDINGS: Contralateral Common Femoral Vein: Respiratory phasicity is normal and symmetric with the symptomatic side. No evidence of thrombus. Normal compressibility. Common Femoral Vein: No evidence of thrombus. Normal compressibility, respiratory phasicity and response to augmentation. Saphenofemoral Junction: No evidence of  thrombus. Normal compressibility and flow on color Doppler imaging. Profunda Femoral Vein: No evidence of thrombus. Normal compressibility and flow on color Doppler imaging. Femoral Vein: No evidence of thrombus. Normal compressibility, respiratory phasicity and response to augmentation. Popliteal Vein: No evidence of thrombus. Normal compressibility, respiratory phasicity and response to augmentation. Calf Veins: No evidence of thrombus. Normal compressibility and flow on color Doppler imaging. Superficial Great Saphenous Vein: No evidence of thrombus. Normal compressibility. Venous Reflux:  None. Other Findings:  None. IMPRESSION: No evidence of deep venous thrombosis in the LEFT lower extremity. Electronically Signed   By: Ulyses SouthwardMark  Boles M.D.   On: 02/09/2018 18:08    Procedures Procedures (including critical care time)  Medications Ordered in ED Medications - No data to display   Initial Impression / Assessment and Plan / ED Course  Triage vital signs and the nursing notes have been reviewed.  Pertinent labs &  imaging results that were available during care of the patient were reviewed and considered in medical decision making (see chart for details).  Patient presents in no acute distress. Physical exam significant for dark pigmentation of left lower leg with open wound. Mild concern for DVT given chronicity of leg swelling. However, U/S assists in ruling DVT out. Discoloration of extremity is consistent with venous stasis. Physical exam and history is not consistent with cellulitis or other skin infection. There is concern that current wound may not heal properly due to patient's diabetes. Advised patient to follow-up with PCP to discuss need for referral to wound specialist. No indication for antibiotics today.  Clinical Course as of Feb 09 1838  Tue Feb 09, 2018  [id number] No DVT seen with ultrasound.    [GM]    Clinical Course User Index [GM] Oniyah Rohe, Sharyon MedicusGabrielle I, PA-C    Final Clinical Impressions(s) / ED Diagnoses  1. Left Lower Leg Swelling. No DVT. Clinical presentation not consistent with cellulitis or other skin infection. Education provided on appropriate cleaning of wound and skin care. Education on OTC and supportive treatments to assist with leg swelling. Advised to follow-up with PCP.  Dispo: Home. After thorough clinical evaluation, this patient is determined to be medically stable and can be safely discharged with the previously mentioned treatment and/or outpatient follow-up/referral(s). At this time, there are no other apparent medical conditions that require further screening, evaluation or treatment.   Final diagnoses:  Left leg swelling    ED Discharge Orders    None        Windy CarinaMortis, Dustin Bumbaugh I, PA-C 02/09/18 [id number]    Jerol Rufener I, PA-C 02/09/18 1839    Melene PlanFloyd, Dan, DO 02/09/18 2316

## 2018-02-09 NOTE — Discharge Instructions (Signed)
Please follow-up with your PCP soon to discuss skin discoloration and if going to a wound specialist is warranted. I have included some information for you on appropriate skin care and things to consider since you are diabetic.

## 2018-08-31 IMAGING — US US EXTREM LOW VENOUS*L*
1 series · 13 of 24 positions shown · non-contrast
Comparison: None

CLINICAL DATA: LEFT lower extremity swelling for 5-6 months, no
improvement with antibiotics, question deep venous thrombosis;
history hypertension, diabetes mellitus



[Series 1: us extrem low venous*left* · 0.09mm/px · 13 of 31 slices shown]
[im 1/31]
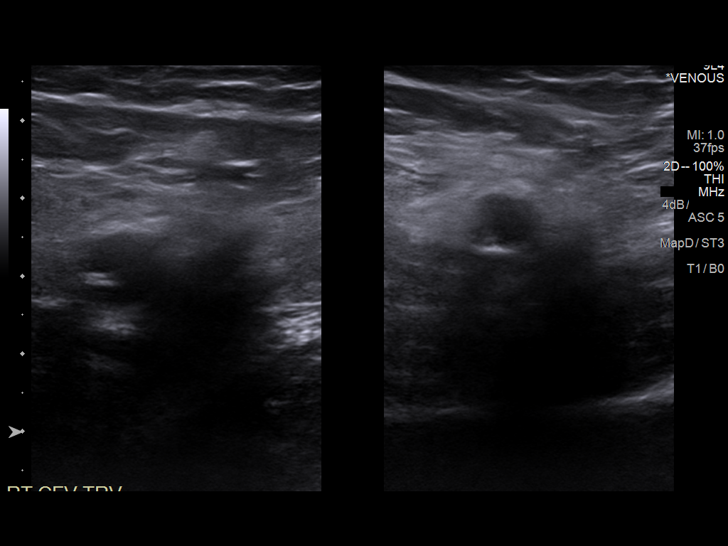
[im 3/31]
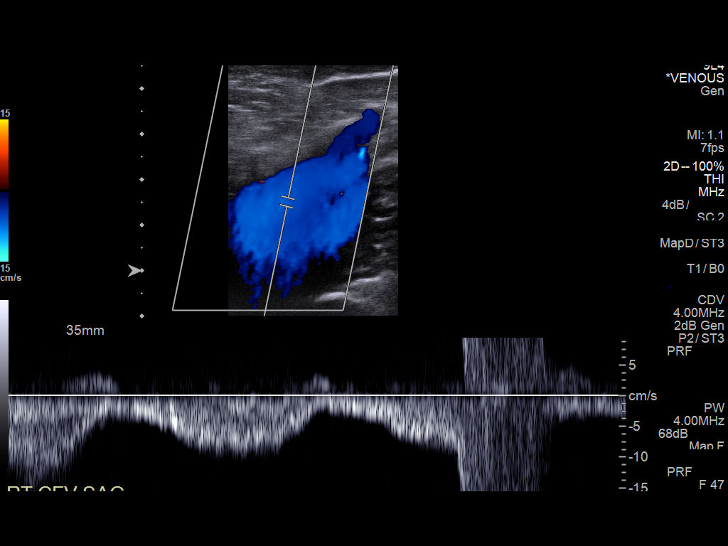
[im 6/31]
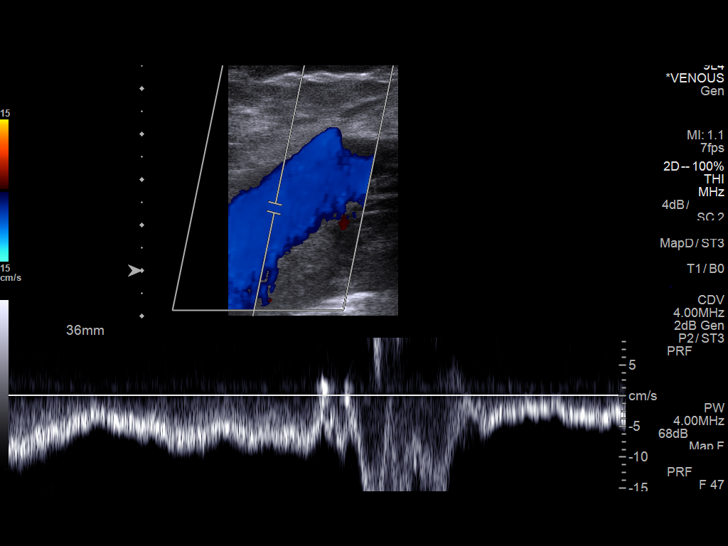
[im 8/31]
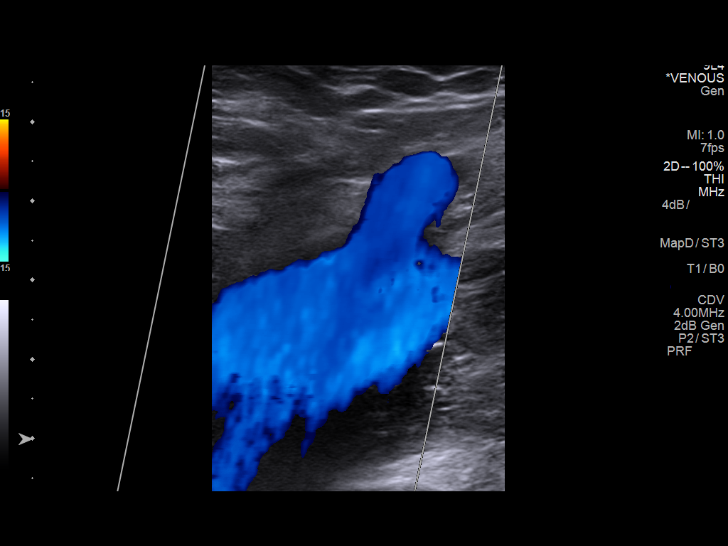
[im 11/31]
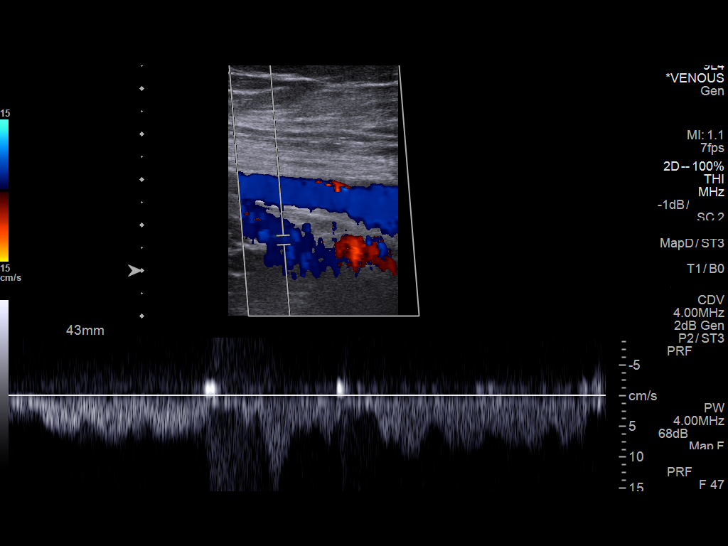
[im 14/31]
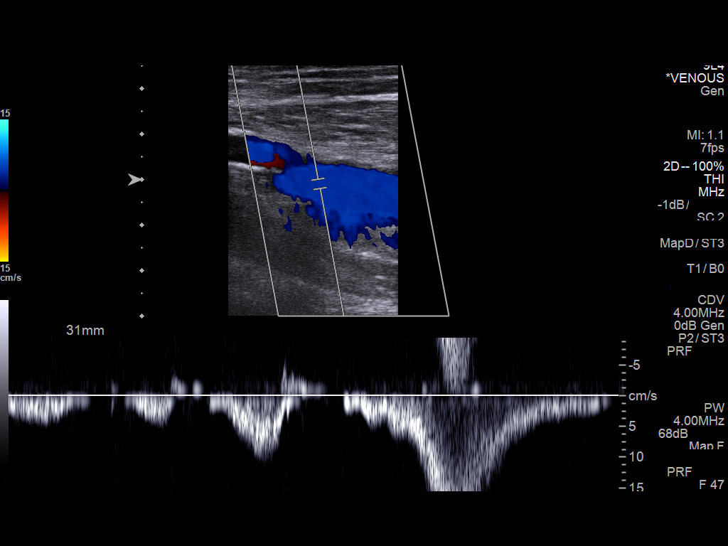
[im 16/31]
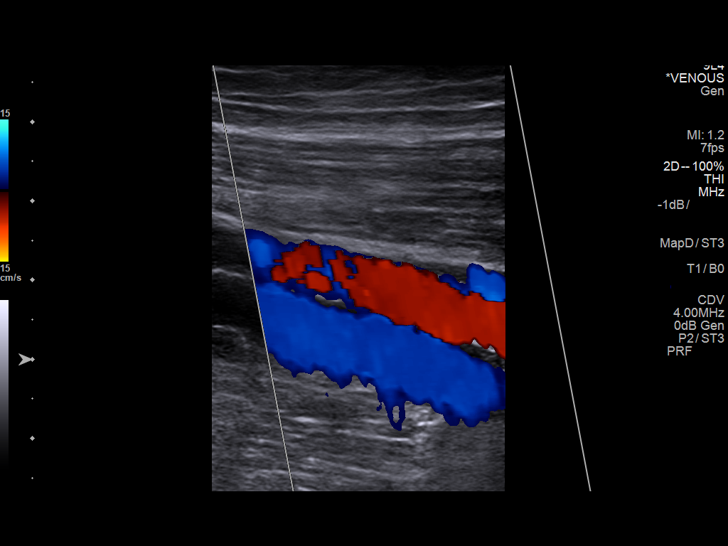
[im 17/31]
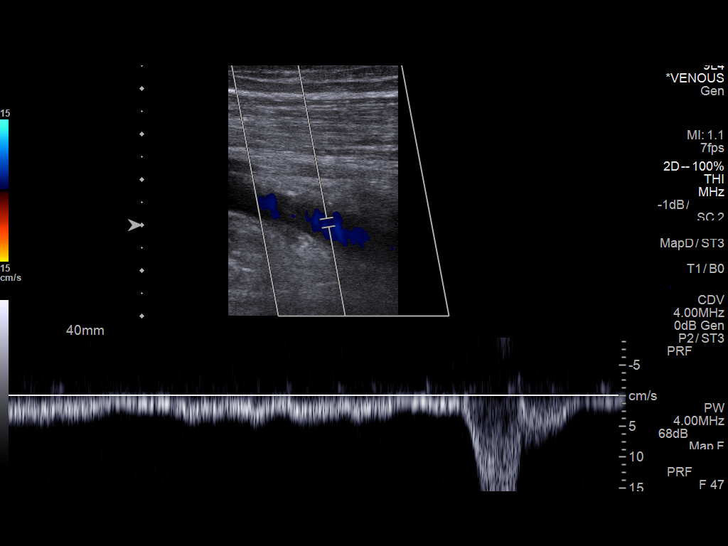
[im 20/31]
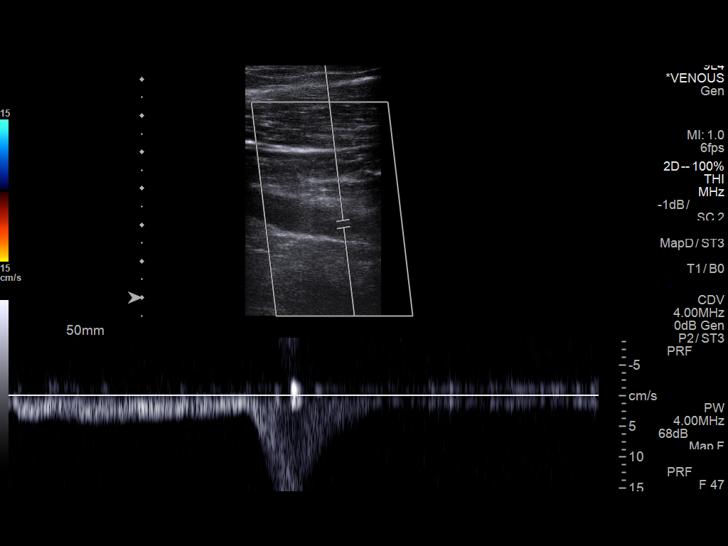
[im 23/31]
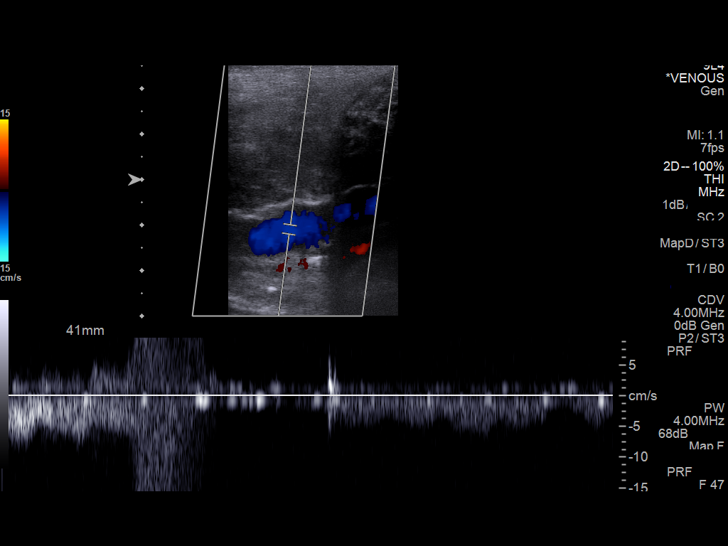
[im 25/31]
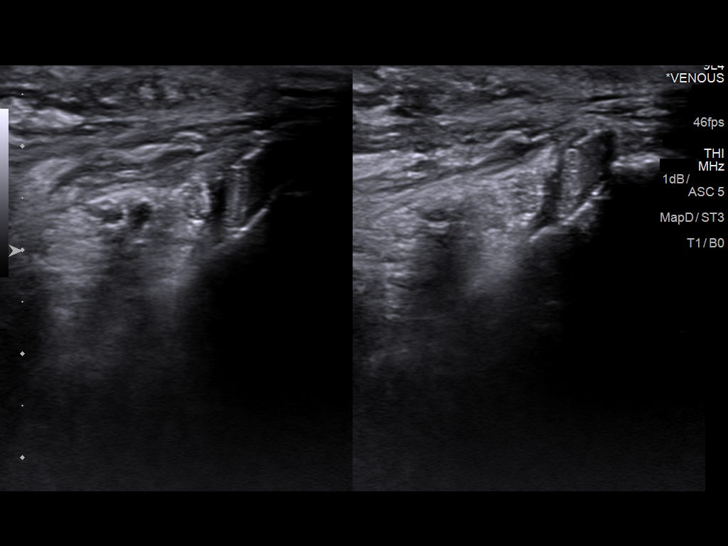
[im 28/31]
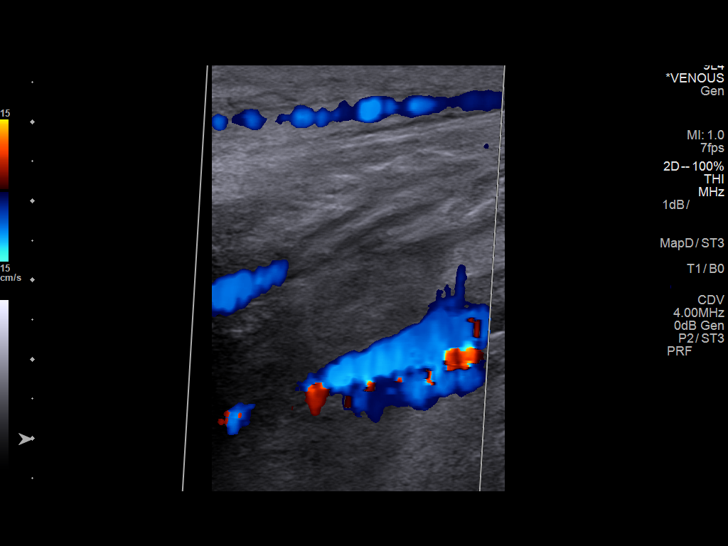
[im 31/31]
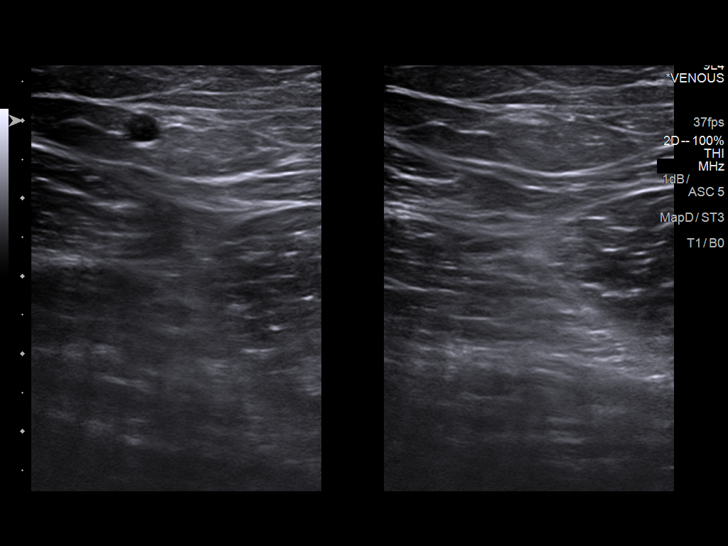

[13 of 24 positions shown; findings below may reference images not displayed]

FINDINGS: Contralateral Common Femoral Vein: Respiratory phasicity is normal
and symmetric with the symptomatic side. No evidence of thrombus.
Normal compressibility.

Common Femoral Vein: No evidence of thrombus. Normal
compressibility, respiratory phasicity and response to augmentation.

Saphenofemoral Junction: No evidence of thrombus. Normal
compressibility and flow on color Doppler imaging.

Profunda Femoral Vein: No evidence of thrombus. Normal
compressibility and flow on color Doppler imaging.

Femoral Vein: No evidence of thrombus. Normal compressibility,
respiratory phasicity and response to augmentation.

Popliteal Vein: No evidence of thrombus. Normal compressibility,
respiratory phasicity and response to augmentation.

Calf Veins: No evidence of thrombus. Normal compressibility and flow
on color Doppler imaging.

Superficial Great Saphenous Vein: No evidence of thrombus. Normal
compressibility.

Venous Reflux:  None.

Other Findings:  None.
IMPRESSION: No evidence of deep venous thrombosis in the LEFT lower extremity.
